# Patient Record
Sex: Female | Born: 1970 | Hispanic: Yes | Marital: Married
Health system: Southern US, Community
[De-identification: ages and names within clinical notes are randomized; demographics above are authoritative.]

---

## 2008-03-16 ENCOUNTER — Other Ambulatory Visit: Payer: Self-pay

## 2008-03-16 ENCOUNTER — Emergency Department: Payer: Self-pay | Admitting: Internal Medicine

## 2013-05-22 ENCOUNTER — Ambulatory Visit: Payer: Self-pay | Admitting: Internal Medicine

## 2013-06-18 ENCOUNTER — Ambulatory Visit: Payer: Self-pay | Admitting: Anesthesiology

## 2013-06-18 LAB — CBC WITH DIFFERENTIAL/PLATELET
Basophil #: 0 10*3/uL (ref 0.0–0.1)
Basophil %: 0.7 %
Eosinophil #: 0.1 10*3/uL (ref 0.0–0.7)
HCT: 34.4 % — ABNORMAL LOW (ref 35.0–47.0)
Lymphocyte #: 1.9 10*3/uL (ref 1.0–3.6)
Lymphocyte %: 34.6 %
MCH: 24.2 pg — ABNORMAL LOW (ref 26.0–34.0)
MCV: 75 fL — ABNORMAL LOW (ref 80–100)
Monocyte #: 0.4 x10 3/mm (ref 0.2–0.9)
Monocyte %: 7.1 %
Neutrophil #: 3.1 10*3/uL (ref 1.4–6.5)
Neutrophil %: 56.5 %
Platelet: 251 10*3/uL (ref 150–440)
WBC: 5.5 10*3/uL (ref 3.6–11.0)

## 2013-06-18 LAB — BASIC METABOLIC PANEL
Anion Gap: 1 — ABNORMAL LOW (ref 7–16)
BUN: 9 mg/dL (ref 7–18)
Calcium, Total: 8.3 mg/dL — ABNORMAL LOW (ref 8.5–10.1)
Creatinine: 0.65 mg/dL (ref 0.60–1.30)
EGFR (African American): >60
Glucose: 106 mg/dL — ABNORMAL HIGH (ref 65–99)
Osmolality: 279 (ref 275–301)
Potassium: 4.2 mmol/L (ref 3.5–5.1)

## 2013-06-18 LAB — HEPATIC FUNCTION PANEL A (ARMC)
Alkaline Phosphatase: 87 U/L (ref 50–136)
Bilirubin,Total: 0.2 mg/dL (ref 0.2–1.0)
SGPT (ALT): 24 U/L (ref 12–78)
Total Protein: 7.3 g/dL (ref 6.4–8.2)

## 2013-06-23 ENCOUNTER — Ambulatory Visit: Payer: Self-pay | Admitting: Surgery

## 2013-06-24 LAB — PATHOLOGY REPORT

## 2014-01-03 IMAGING — US ABDOMEN ULTRASOUND
1 series · 13 of 25 positions shown · non-contrast
Comparison: none

REASON FOR EXAM: RUQ Pain
COMMENTS:

PROCEDURE:     US  - US ABDOMEN GENERAL SURVEY  - May 22, 2013  [DATE]
RESULT:     Comparison: None
TECHNIQUE: Multiple gray-scale and color-flow Doppler images of the abdomen
are presented for review.

[Series 1: abdomen ultrasound · 0.28mm/px · 13 of 109 slices shown]
[im 1/109]
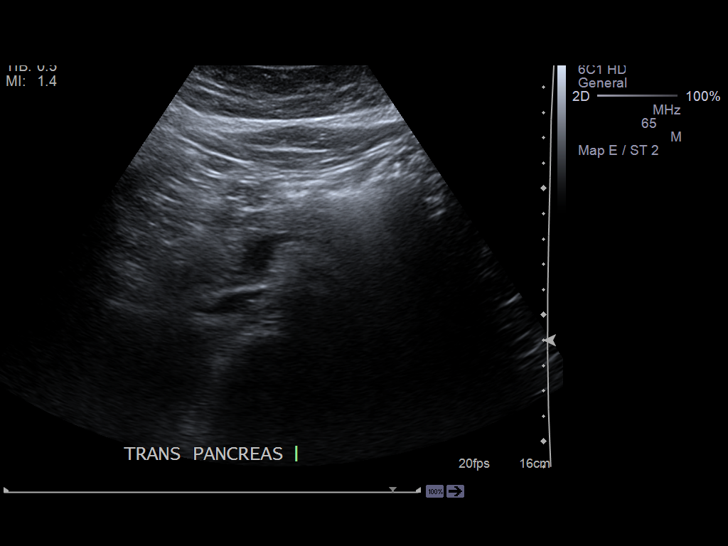
[im 10/109]
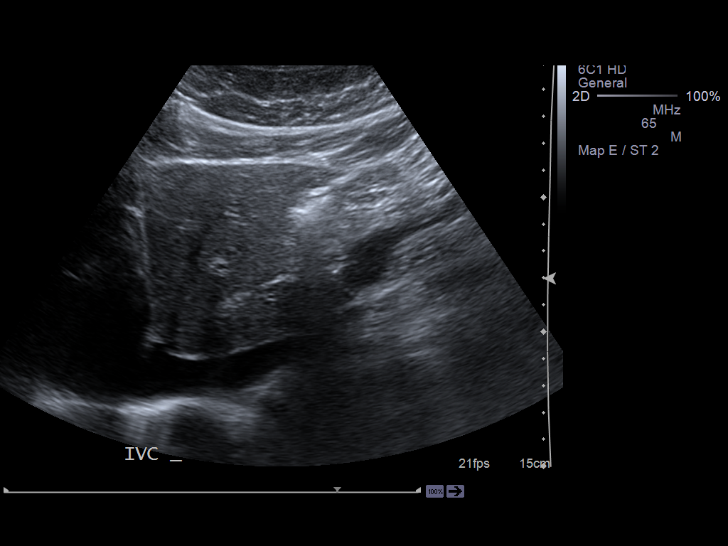
[im 19/109]
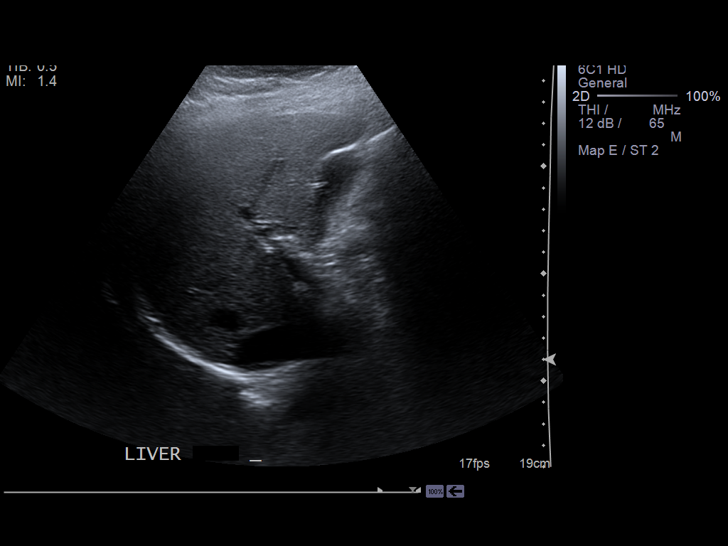
[im 28/109]
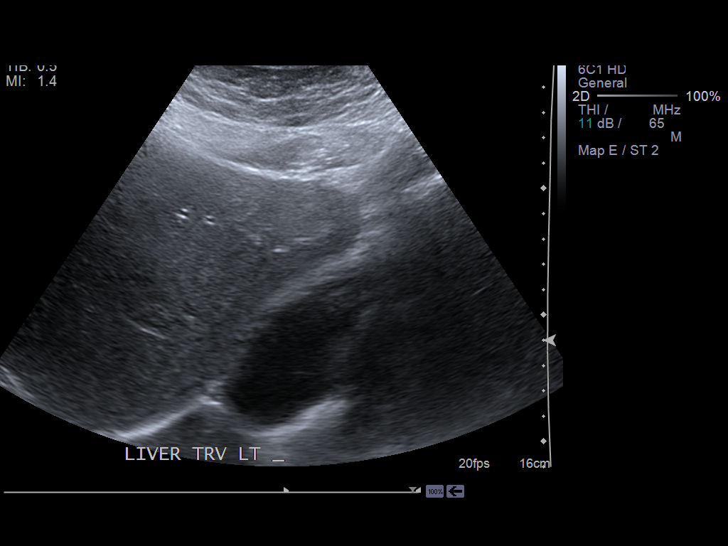
[im 37/109]
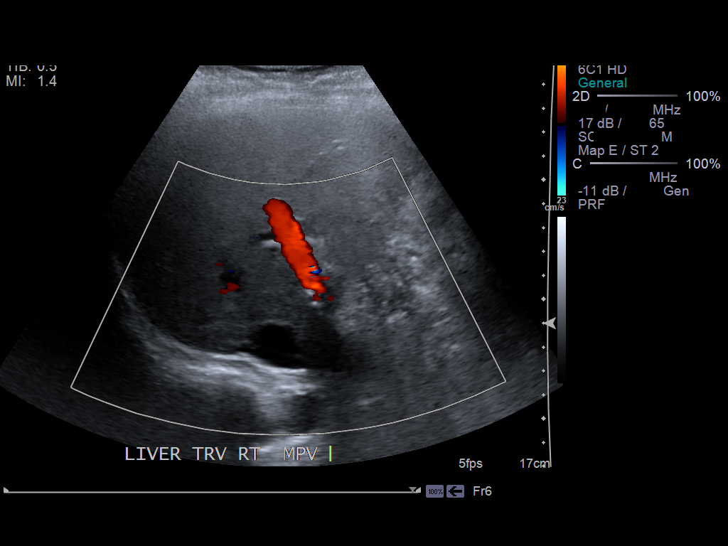
[im 46/109]
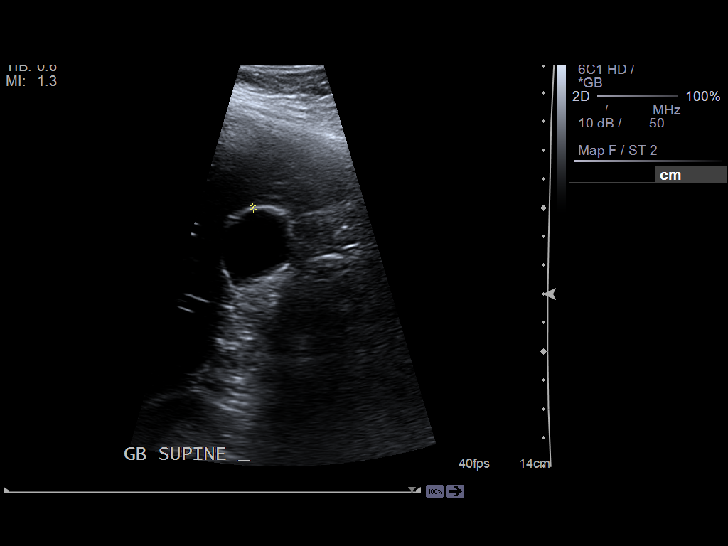
[im 55/109]
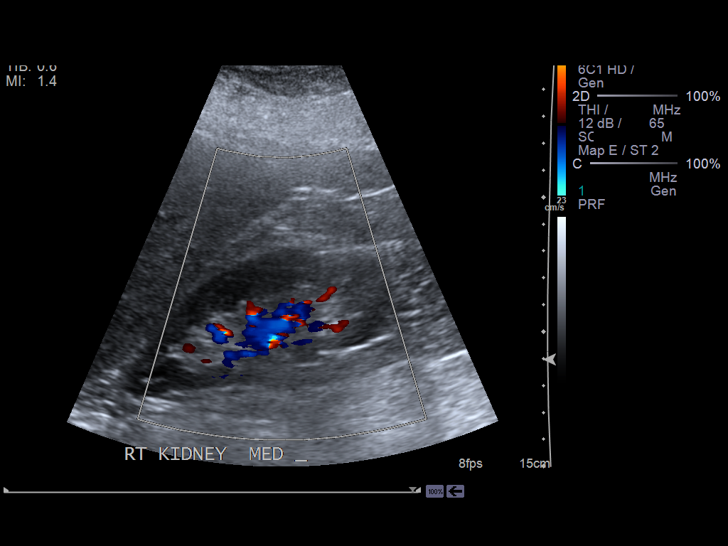
[im 64/109]
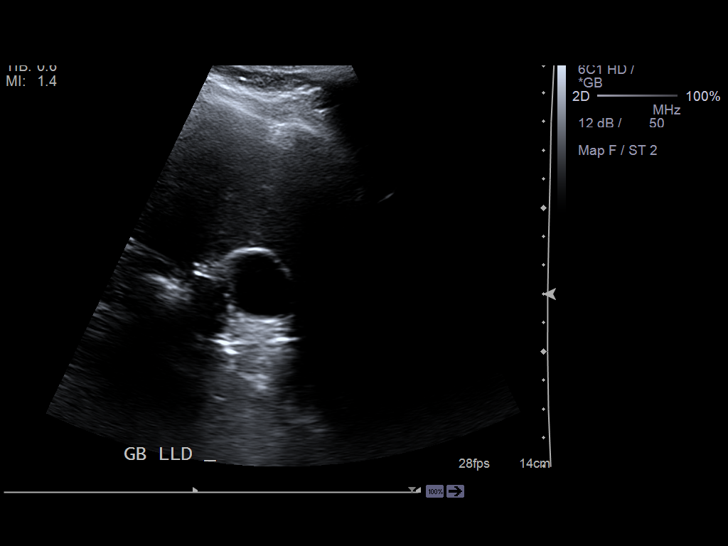
[im 73/109]
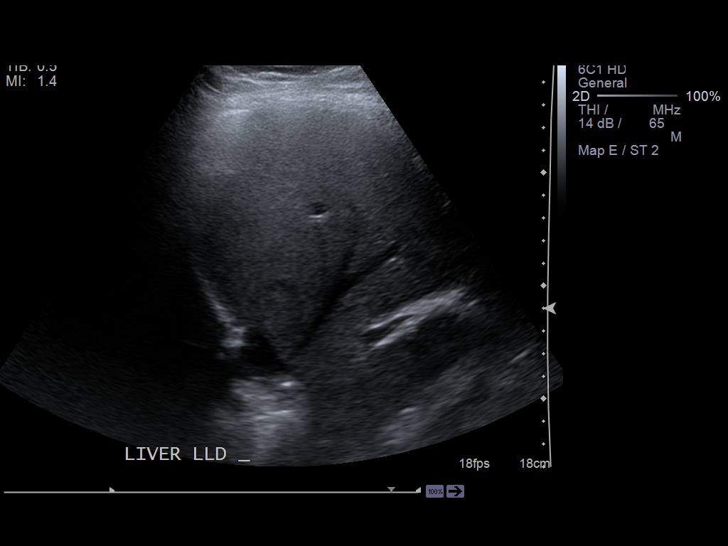
[im 82/109]
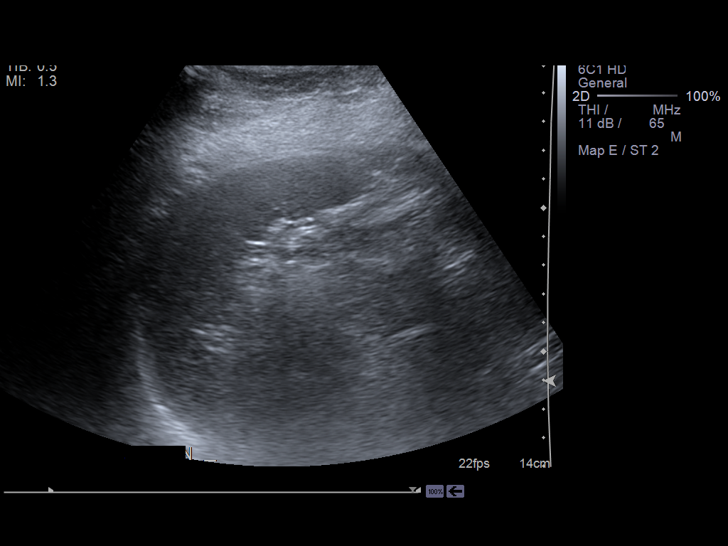
[im 91/109]
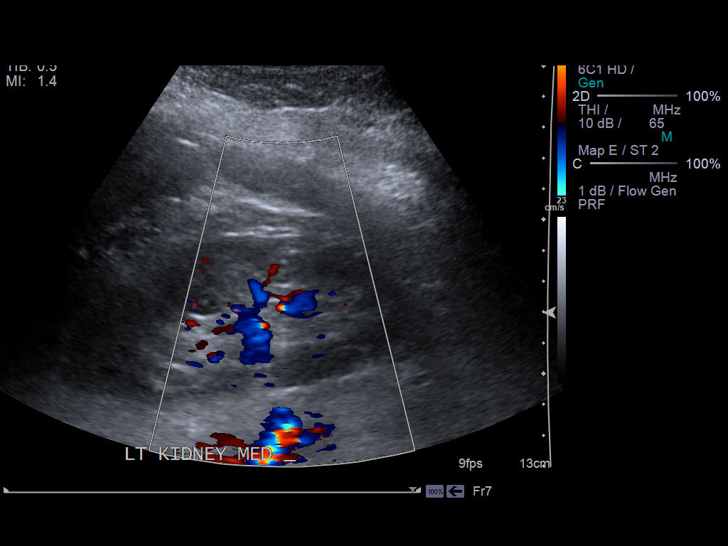
[im 100/109]
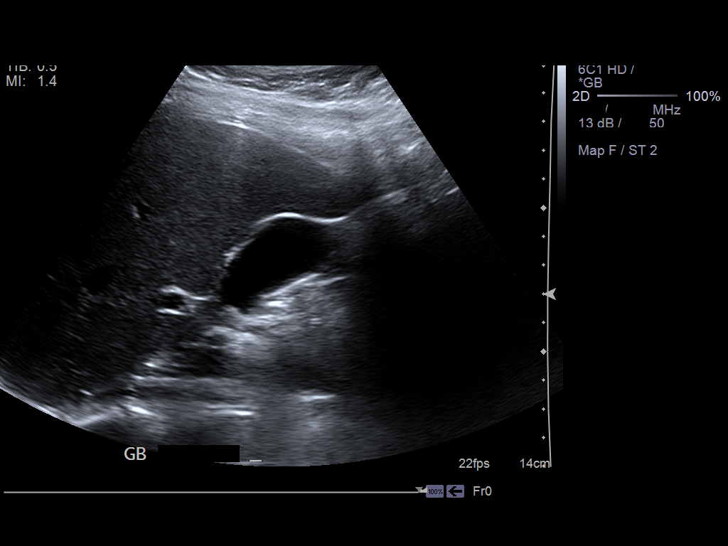
[im 109/109]
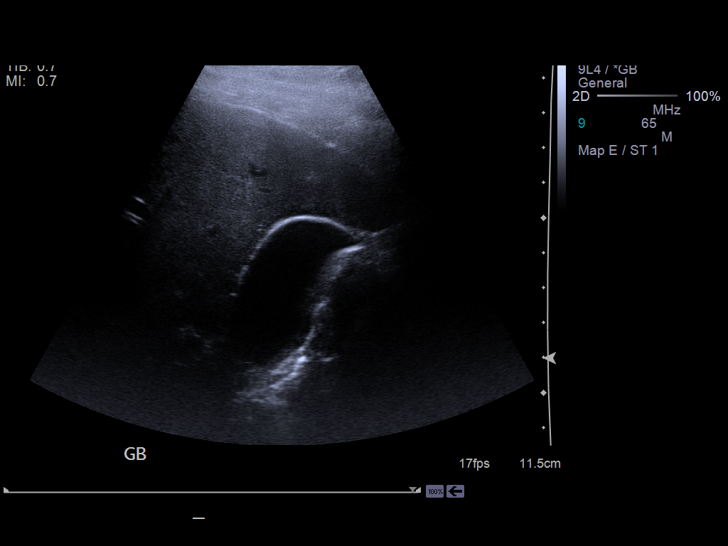

[13 of 25 positions shown; findings below may reference images not displayed]

FINDINGS: Visualized portions of the liver demonstrate normal echogenicity and normal
contours. The liver is without evidence of a focal hepatic lesion.

There are tiny mobile echogenic foci within the gallbladder without
shadowing likely representing tiny cholelithiasis versus cholesterol
crystals. There is no intra- or extrahepatic biliary ductal dilatation. The
common duct measures 2.7 mm in maximal diameter. There is no gallbladder
wall thickening, pericholecystic fluid, or sonographic Murphy's sign.

The visualized portion of the pancreas is normal in echogenicity. The spleen
is unremarkable. Bilateral kidneys are normal in echogenicity and size. The
right kidney measures 11.2 x 4 x 3.7 cm. The left kidney measures 12.4 x
x 4.6 cm. There are no renal calculi or hydronephrosis. The abdominal aorta
and IVC are unremarkable.
IMPRESSION: 1.  Tiny mobile echogenic foci within the gallbladder without shadowing
likely representing tiny cholelithiasis versus cholesterol crystals. No
sonographic evidence of acute cholecystitis.

[REDACTED]

## 2015-04-22 NOTE — Op Note (Signed)
PATIENT NAME:  Irven EasterlyCAMPOSECO, Aris L MR#:  [id number] DATE OF BIRTH:  01-10-1971  DATE OF PROCEDURE:  06/23/2013  PREOPERATIVE DIAGNOSES: Symptomatic cholelithiasis and biliary colic.   POSTOPERATIVE DIAGNOSES: Symptomatic cholelithiasis and biliary colic.   PROCEDURE PERFORMED: Laparoscopic cholecystectomy.   SURGEON: Melvina Pangelinan A. Egbert GaribaldiBird, M.D.   ASSISTANT: Surgical scrub technologists x2.   TYPE OF ANESTHESIA: General endotracheal.   FINDINGS: Chronic inflammatory changes.   SPECIMENS: Gallbladder with contents.   DESCRIPTION OF PROCEDURE: Informed consent, supine position, time out procedure, sterile prep and drape. A 12 mm blunt Hassan trocar was placed through an open technique through an infraumbilical transverse oriented skin incision. Remaining trocars were placed in the standard location, Pneumoperitoneum was established. The patient was then positioned in reverse Trendelenburg and airplane right side up. The gallbladder was grasped along the fundus and elevated towards the right shoulder. Adhesions of the omentum were taken down off the body of the gallbladder with blunt technique and point cautery application. Lateral traction was placed on Hartmann's pouch. The hepatoduodenal ligament was then dissected and incised with blunt technique. Critical view of safety cholecystectomy was performed. The posterior cystic artery branch was identified. Two clips were placed on the portal side of the cystic duct, one on the gallbladder side and the structure was then divided. Cystic artery was divided between anterior and posterior branches separately. This was accomplished with hemoclips. The remaining dissection off the gallbladder fossa demonstrated no aberrant vessel or bile duct. The specimen was contained in an Endo Catch device and retrieved. Pneumoperitoneum was re-established. The right upper quadrant was irrigated with a total of 1 L of normal saline and aspirated dry. Point hemostasis in the  gallbladder fossa was obtained. Ports were then removed under direct visualization. A total of 30 mL of 0.25% plain Marcaine was infiltrated along all skin and fascial incisions prior to closure. An additional figure-of-eight #0 Vicryl suture in vertical orientation was placed through the fascia of the umbilical port. The existing stay sutures tied to each other. Vicryl 4-0 subcuticular was applied to all skin edges, followed by the application of benzoin, Steri-Strips, Telfa, and Tegaderm. The patient was then subsequently extubated and taken to the recovery room in stable and satisfactory condition by anesthesia services.   ____________________________ Redge GainerMark A. Egbert GaribaldiBird, MD mab:aw D: 06/24/2013 07:32:18 ET T: 06/24/2013 07:41:41 ET JOB#: [id number]  cc: Loraine LericheMark A. Egbert GaribaldiBird, MD, <Dictator> Raynald KempMARK A Anarely Nicholls MD ELECTRONICALLY SIGNED 06/24/2013 10:36
